# Patient Record
Sex: Male | Born: 1995 | Race: White | Hispanic: No | Marital: Single
Health system: Southern US, Community
[De-identification: ages and names within clinical notes are randomized; demographics above are authoritative.]

---
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
## 2021-05-09 ENCOUNTER — Other Ambulatory Visit: Payer: Self-pay

## 2021-05-09 ENCOUNTER — Ambulatory Visit (INDEPENDENT_AMBULATORY_CARE_PROVIDER_SITE_OTHER): Payer: 59 | Admitting: Clinical

## 2021-05-09 DIAGNOSIS — R4589 Other symptoms and signs involving emotional state: Secondary | ICD-10-CM | POA: Diagnosis not present

## 2021-05-09 DIAGNOSIS — F4010 Social phobia, unspecified: Secondary | ICD-10-CM
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
## 2021-05-09 NOTE — Progress Notes (Signed)
? ? ? ? ? ? ? ? ? ? ? ? ? ? ?  Eileen Leuthe, PhD ?

## 2021-05-09 NOTE — Progress Notes (Deleted)
? ? ? ? ? ? ? ? ? ? ? ? ? ? ?  Eileen Leuthe, PhD ?

## 2021-05-25 ENCOUNTER — Ambulatory Visit (INDEPENDENT_AMBULATORY_CARE_PROVIDER_SITE_OTHER): Payer: 59 | Admitting: Clinical

## 2021-05-25 DIAGNOSIS — F4010 Social phobia, unspecified: Secondary | ICD-10-CM

## 2021-05-25 DIAGNOSIS — R4589 Other symptoms and signs involving emotional state: Secondary | ICD-10-CM | POA: Diagnosis not present

## 2021-05-25 NOTE — Progress Notes (Addendum)
[facility] Counselor/Therapist Progress Note ? ?Legal name: Andrew Shannon, Preferred Name: "Andrew Shannon MRN: [id number]   ? ?Date: 05/26/21 ? ?Time Spent: 1:00 pm - 2:00 pm: 60 Minutes ? ?Type of Service Provided Individual Therapy  ?Type of Contact in-person ?Location: office       ? ?Mental Status Exam: ?Appearance:  Fairly Groomed     ?Behavior: Evasive (slightly)  ?Motor: Normal  ?Speech/Language:  Clear and Coherent  ?Affect: Tearful  ?Mood: sad at times  ?Thought process: normal  ?Thought content:   WNL  ?Sensory/Perceptual disturbances:   WNL  ?Orientation: oriented to person, place, time/date, and situation  ?Attention: Good  ?Concentration: Fair  ?Memory: WNL  ?Fund of knowledge:  Good  ?Insight:   Fair  ?Judgment:  Fair  ?Impulse Control: Fair  ? ?Risk Assessment: ?Danger to Self:  No - denied SI ?Self-injurious Behavior: No - denied SBI ?Danger to Others: None reported ?Duty to Warn:no ? ?Presenting Problems, Reported Symptoms, and /or Interim History: Andrew Shannon experienced the loss of a family friend in the interm between sessions. She chose not to attend the funeral due to concerns that her extended family did not know about her life in the past few years (e.g., her leaving college and gender transition). Goals were discussed and Andrew Shannon signed in a agreement.  ? ?Subjective: Andrew Shannon presented for an individual outpatient therapy session. The following was addressed during sessions.  ? ?As noted, Andrew Shannon experienced the lass of a family friend recently. She made the choice to not attend the funeral or visit her neighbor when they were in the hospital. Andrew Shannon reported that she was feeling shame about not going to the funeral or going to the hospital (she did not go to the hospital because hospitals are hard for her after her mother became ill and passed away quickly). Andrew Shannon reported that she often engages in escapism when she is in having difficulty. A way for her to acknowledge her grief was  discussed (she planned to bake something for the surviving spouse of her friend). Andrew Shannon rated her mood as between a 3 and 4 and (with a 1 being the worst her mood had ever been), and a 6 on the anxiety scale. Andrew Shannon had tried rating her mood between visits, but struggled and would "over think" things. Andrew Shannon also made a list of things that she wants to change about her life. However, she reported an increase in upset after seeing all of the things that she wanted to change. The link between thoughts-feelings-behavior and the relationship to anxiety and depression were discussed. The idea of slow, steady, and sustainable progress was discussed. Andrew Shannon selected one thing from the list to start.  ? ?Interventions/Psychotherapy Techniques Used During Session: Cognitive Behavioral Therapy, Motivational Interviewing, Grief Therapy, and Psycho-education/Bibliotherapy ? ?Diagnosis: Social anxiety disorder ? ?Symptoms of depression ? ?MENTAL HEALTH INTERVENTIONS USED DURING TREATMENT & PATIENT'S RESPONSE TO INTERVENTIONS:  ?Short-term Objective addressed today:Andrew Shannon will be able to verbally express understanding of the relationship between feelings of depression and their impact on thinking patterns and behaviors AND Andrew Shannon will be able to verbally express understanding of the relationship between feelings of depression and their impact on thinking patterns and behaviors.  ?Mental health techniques used: Objective was addressed in session through the use of Cognitive Behavioral Therapy and discussion. Andrew Shannon's response was positive.  ?Progress Toward Goal: progressing ? ?Short-term Objective addressed today: Andrew Shannon will be able to identify thinking patterns and beliefs that support depression ?Mental health techniques used: Objective  was addressed in session through the use of Cognitive Behavioral Therapy, discussion. Andrew Shannon's response was positive, but she reported that she felt similar at the beginning and end of the  session ?Progress Toward Goal: progressing  ?   ? ?PLAN  ?1. Andrew Shannon will return for a therapy session.   ?2. Homework Given:  Andrew Shannon will do laundry. This homework will be reviewed with the family at the next visit.  ?3. During the next session check on mood and anxiety and things she wants to change, discuss why she can tell herself things logically but it unable to believe them.   ? ? ?Andrew Chess, PhD ? ? ? ?

## 2021-05-26 NOTE — Progress Notes (Signed)
Legal Name: Lenore Manner ; Preferred Name: Andrew Shannon ?MRN [id number] ? ? ?Individual Treatment Plan  ? ?Plan Developed: 05/09/2021 &05/26/2021 ?Anticipated end date: 01/2022   ? ?Goals of therapy will be to help manage and/or decrease symptoms associated with Andrew Shannon's diagnosis to improve daily functioning. ? ?Who participated in treatment planning:  Therapist, patient ?The following goals were developed in collaboration with the patient.  ? ?Problem/Need: Anxiety - Andrew Shannon is experiencing high levels of anxiety.  ?Long-Term Goal #1:  ?Short-Term Objectives: ?Objective 1A: Andrew Shannon will be able to verbally express understanding of the relationship between feelings of anxiety and their impact on thinking patterns and behaviors.  ?Objective 1B: Andrew Shannon will be able to verbalize an understanding of the role that distorted thinking plays in creating fears, excessive worry, and ruminations ?Objective 1C: Andrew Shannon will be able to use coping strategies to manage anxiety feelings  ?Interventions: Cognitive Behavioral Therapy, Assertiveness/Communication, Mindfulness Meditation, Motivational Interviewing, and Psycho-education/Bibliotherapy, coping skills, and other evidenced-based practices will be used to promote progress towards healthy functioning and to help manage decrease symptoms associated with their diagnosis.  ?Treatment Regimen: Individual skill building sessions every 2-3 weeks to assist with reaching treatment goals/objectives ?Target Date: 01/2022 ?Responsible Party: therapist and patient ?Person delivering treatment: Licensed Psychologist Ronnie Derby, PhD will support the patient's ability to achieve the goals identified. ? ?Problem/Need: Depressed mood - Andrew Shannon is reporting high levels of depression currently. This goal may also include processing grief related to the loss of Andrew Shannon's mother ?Long-Term Goal #2: Reduce overall level, frequency, and intensity of the feelings of depression evidenced by self-report.   ? ?Short-Term Objectives: ?Objective 2A: Andrew Shannon will be able to verbally express understanding of the relationship between feelings of depression and their impact on thinking patterns and behaviors.  ?Objective 2B: Andrew Shannon will be able to identify thinking patterns and beliefs that support depression ?Objective 2C:  Andrew Shannon will be able to engage in behavioral activation strategies to combat depression within 6 months.  ?Interventions: Cognitive Behavioral Therapy, Mindfulness Meditation, Motivational Interviewing, and Grief Therapy, coping skills and other evidenced-based practices will be used to promote progress towards healthy functioning and to help manage decrease symptoms associated with their diagnosis.  ?Treatment Regimen: Individual skill building sessions every 2-3 weeks to assist with reaching treatment goals/objectives ?Target Date: 01/2022 ?Responsible Party: therapist and patient ?Person delivering treatment: Licensed Psychologist Ronnie Derby, PhD will support the patient's ability to achieve the goals identified. ? ?Problem/Need: social challenges - Andrew Shannon reported that she would like to be less hesitant to engage socially and not take "the easy way out" and avoid situations that make her anxious.   ?Long-Term Goal #3: Strengthen any social skill weaknesses and increase Andrew Shannon's tolerance of uncomfortable feelings.  ?Short-Term Objectives: ?Objective 3A: Identify any social skill weaknesses and start identifying ways to address them  ?Objective 3B: Andrew Shannon will be able to tolerate discomfort for a few minutes.  ?Interventions: Cognitive Behavioral Therapy, Psychologist, occupational, and Psycho-education/Bibliotherapy  and other evidenced-based practices will be used to promote progress towards healthy functioning and to help manage decrease symptoms associated with their diagnosis.  ?Treatment Regimen: Individual skill building sessions every 2-3 weeks to assist with reaching treatment  goals/objectives ?Target Date: 01/2022 ?Responsible Party: therapist and patient ?Person delivering treatment: Licensed Psychologist Ronnie Derby, PhD will support the patient's ability to achieve the goals identified. ? ? ? ?patient participated in treatment planning: ?_X_ contributed to goals and plan ?_X_ aware of plan content ?__ reviewed written plan ?__ refused to  participate ?__ unable to participate because _________________________________________ ? ? ?Progress and treatment plan will be reviewed periodically (at least every 12 months, or sooner if needed). This treatment plan was reviewed with Andrew Shannon on 05/25/2021 and patient signed in agreement.  ? ? ?Ronnie Derby, PhD ?

## 2021-05-26 NOTE — Addendum Note (Signed)
Addended by: Georgia Duff on: 05/26/2021 02:17 PM ? ? Modules accepted: Level of Service ? ?

## 2021-06-08 ENCOUNTER — Ambulatory Visit (INDEPENDENT_AMBULATORY_CARE_PROVIDER_SITE_OTHER): Payer: 59 | Admitting: Clinical

## 2021-06-08 DIAGNOSIS — R4589 Other symptoms and signs involving emotional state: Secondary | ICD-10-CM | POA: Diagnosis not present

## 2021-06-08 DIAGNOSIS — F4010 Social phobia, unspecified: Secondary | ICD-10-CM | POA: Diagnosis not present

## 2021-06-08 NOTE — Progress Notes (Addendum)
[facility] Counselor/Therapist Progress Note  Patient ID/Name: Andrew Shannon (legal name, Andrew Shannon)  MRN: [id number]    Date: 06/08/21  Time Spent: 4:05 pm - 5:10 pm: 65 Minutes  Type of Service Provided Individual Therapy  Type of Contact in-person Location: office   Mental Status Exam: Appearance:  Fairly Groomed     Behavior: Appropriate, slightly challenging   Motor: Normal  Speech/Language:  Normal Rate  Affect: Appropriate  Mood: normal and sometimes a bit sad  Thought process: normal  Thought content:   WNL  Sensory/Perceptual disturbances:   WNL  Orientation: oriented to person, place, time/date, and situation  Attention: Good  Concentration: Good  Memory: WNL  Fund of knowledge:  Good  Insight:   Fair  Judgment:  Fair  Impulse Control: Fair   Risk Assessment: Danger to Self:  No - denied Self-injurious Behavior: No Danger to Others: No - denied Duty to Warn:no  Presenting Problems, Reported Symptoms, and /or Interim History: Andrew Shannon reported that things are about the same for them.  Some ongoing challenges with their father were discussed.  Subjective: Andrew Shannon presented for an individual outpatient therapy session. The following was addressed during sessions.   Andrew Shannon reported that she talked to her father about going to visit the widow of the person that passed away but described that her father was unempathetic and they were unable to complete the activity. Andrew Shannon's father and brother reportedly had a talk with Andrew Shannon which was challenging.  Andrew Shannon described her overall living situation as poor.  Between visits Andrew Shannon was able to complete 2 loads of laundry but she focused on the amount of laundry that she had not completed.  She reported that by the end of the day, especially when she has had to engage in any social interactions, she feels drained and does not have the energy to complete much.  She identified her girlfriend as the thing that  makes her feel the best but does not talk to this person every day.  Ways to increase their communication was discussed but Andrew Shannon did not seem open to exploring these possibilities. Andrew Shannon reported that their social anxiety has continued, and described worrying about the therapy session for 3 days prior to the visit.  Therapist discussed with Andrew Shannon that her being willing to come to the visit was a success in and of itself and discussed Andrew Shannon's tendency to negate all progress by highlighting the areas where they are still struggling (described in session as "yes, but..."). Andrew Shannon acknowledged that they had a tendency to do this.  Therapist and Andrew Shannon had a discussion about the process of therapy as well. Andrew Shannon reported that engaging with therapy is often very challenging and although she has tried in the past she tends to get to about this point and then stops coming to sessions.  Therapist encouraged Andrew Shannon to provide feedback in an ongoing way, and explained that therapy will be more effective if Andrew Shannon is willing/able to provide this type of direct feedback.  The overall discussion seemed to go well and at the end several strategies suggested by the therapist were reviewed with Andrew Shannon indicating that once she was unwilling at this point to try (e.g., trying to contact her girlfriend more often), the strategies she would think about (e.g., going for a walk once or twice a week), and what she was willing to commit to doing (another load of laundry).  The possible use of medication was also included in this discussion, but Andrew Shannon did  not seem to be enthusiastic about this option at this time.   Interventions/Psychotherapy Techniques Used During Session: Cognitive Behavioral Therapy, Assertiveness/Communication, and Psycho-education/Bibliotherapy  Diagnosis: Social anxiety disorder  Symptoms of depression  MENTAL HEALTH INTERVENTIONS USED DURING TREATMENT & PATIENT'S RESPONSE TO INTERVENTIONS:   Short-term Objective addressed today: Andrew Shannon will be able to engage in behavioral activation strategies to combat depression within 6 months.  Mental health techniques used: Objective was addressed in session through the use of Cognitive Behavioral Therapy, Assertiveness/Communication, and Motivational Interviewing and discussion. Andrew Shannon's response was mixed - Andrew Shannon has a hard time identifying small attainable changes she could make to improve the quality of her life, rather she focuses on the many areas that she feels need to be addressed then and then feels overwhelmed and discouraged.  Progress Toward Goal: Some progress  Short-term Objective addressed today:Andrew Shannon will be able to verbalize an understanding of the role that distorted thinking plays in creating fears, excessive worry, and ruminations Mental health techniques used: Objective was addressed in session through the use of Cognitive Behavioral Therapy and discussion. Andrew Shannon's response was mixed - Andrew Shannon is sometimes able to verbalize the thoughts that she is having that is perpetuating or contributing to feelings of depression and anxiety but struggles to fully describe them and has difficulty with the idea of an alternative thought.  Progress Toward Goal: Some progress     PLAN  1. Andrew Shannon will return for a therapy session.   2. Homework Given: Complete a load of laundry, think about going for a walk once or twice a week. This homework will be reviewed with Andrew Shannon at the next visit.  3. During the next session check in on mood and anxiety, continue to work on addressing daily life tasks - Andrew Shannon reported that some of their reluctance to contact their girlfriend more often was related to being "ghosted" in the past and this should be further explored in future sessions.     Ronnie Derby, PhD   Individual Treatment Plan - please see the note from 05/26/2021 for complete treatment plan information   Problem/Need: Anxiety - Andrew Shannon is  experiencing high levels of anxiety.  Long-Term Goal #1:  Short-Term Objectives: Objective 1A: Andrew Shannon will be able to verbally express understanding of the relationship between feelings of anxiety and their impact on thinking patterns and behaviors.  Objective 1B: Andrew Shannon will be able to verbalize an understanding of the role that distorted thinking plays in creating fears, excessive worry, and ruminations Objective 1C: Andrew Shannon will be able to use coping strategies to manage anxiety feelings  Interventions: Cognitive Behavioral Therapy, Assertiveness/Communication, Mindfulness Meditation, Motivational Interviewing, and Psycho-education/Bibliotherapy, coping skills, and other evidenced-based practices will be used to promote progress towards healthy functioning and to help manage decrease symptoms associated with their diagnosis.  Treatment Regimen: Individual skill building sessions every 2-3 weeks to assist with reaching treatment goals/objectives Target Date: 01/2022 Responsible Party: therapist and patient Person delivering treatment: Licensed Psychologist Ronnie Derby, PhD will support the patient's ability to achieve the goals identified.  Problem/Need: Depressed mood - Andrew Shannon is reporting high levels of depression currently. This goal may also include processing grief related to the loss of Andrew Shannon's mother Long-Term Goal #2: Reduce overall level, frequency, and intensity of the feelings of depression evidenced by self-report.   Short-Term Objectives: Objective 2A: Andrew Shannon will be able to verbally express understanding of the relationship between feelings of depression and their impact on thinking patterns and behaviors.  Objective 2B: Andrew Shannon will be able to  identify thinking patterns and beliefs that support depression Objective 2C:  Andrew Shannon will be able to engage in behavioral activation strategies to combat depression within 6 months.  Interventions: Cognitive Behavioral Therapy, Mindfulness  Meditation, Motivational Interviewing, and Grief Therapy, coping skills and other evidenced-based practices will be used to promote progress towards healthy functioning and to help manage decrease symptoms associated with their diagnosis.  Treatment Regimen: Individual skill building sessions every 2-3 weeks to assist with reaching treatment goals/objectives Target Date: 01/2022 Responsible Party: therapist and patient Person delivering treatment: Licensed Psychologist Ronnie Derby, PhD will support the patient's ability to achieve the goals identified.  Problem/Need: social challenges - Andrew Shannon reported that she would like to be less hesitant to engage socially and not take "the easy way out" and avoid situations that make her anxious.   Long-Term Goal #3: Strengthen any social skill weaknesses and increase Andrew Shannon's tolerance of uncomfortable feelings.  Short-Term Objectives: Objective 3A: Identify any social skill weaknesses and start identifying ways to address them  Objective 3B: Andrew Shannon will be able to tolerate discomfort for a few minutes.  Interventions: Cognitive Behavioral Therapy, Psychologist, occupational, and Psycho-education/Bibliotherapy  and other evidenced-based practices will be used to promote progress towards healthy functioning and to help manage decrease symptoms associated with their diagnosis.  Treatment Regimen: Individual skill building sessions every 2-3 weeks to assist with reaching treatment goals/objectives Target Date: 01/2022 Responsible Party: therapist and patient Person delivering treatment: Licensed Psychologist Ronnie Derby, PhD will support the patient's ability to achieve the goals identified.    Ronnie Derby, PhD

## 2021-06-22 ENCOUNTER — Ambulatory Visit (INDEPENDENT_AMBULATORY_CARE_PROVIDER_SITE_OTHER): Payer: Commercial Managed Care - HMO | Admitting: Clinical

## 2021-06-22 DIAGNOSIS — R4589 Other symptoms and signs involving emotional state: Secondary | ICD-10-CM

## 2021-06-22 DIAGNOSIS — F4010 Social phobia, unspecified: Secondary | ICD-10-CM | POA: Diagnosis not present

## 2021-06-22 NOTE — Progress Notes (Signed)
[facility] Counselor/Therapist Progress Note  Patient ID: Press photographerCammie Shannon (legal name Andrew Shannon MannerChristian A Shannon)  MRN: [id number]    Date: 06/22/21  Time Spent: 11:10 am - 12:17 pm: 67 Minutes  Type of Service Provided Individual Therapy  Type of Contact in-person Location: office  Mental Status Exam: Appearance:  Casual     Behavior: Sharing  Motor: Normal  Speech/Language:  Clear and Coherent  Affect: Congruent  Mood: normal  Thought process: normal  Thought content:   WNL  Sensory/Perceptual disturbances:   WNL  Orientation: oriented to person, place, time/date, and situation  Attention: Good  Concentration: Good  Memory: WNL  Fund of knowledge:  Fair  Insight:   Fair  Judgment:  Fair  Impulse Control: Good   Risk Assessment: no apparent indicators of SI or HI during current visit   Presenting Problems, Reported Symptoms, and /or Interim History: Andrew Shannon presented for a therapy session addressing anxiety and mood concerns. Andrew Shannon reported that they started receiving gender affirming care from a new physician and was feeling positive about this they began several new medications including spironolactone, estradiol, and finasteride.   Subjective: Andrew Shannon presented for an individual outpatient therapy session. The following was addressed during sessions.   Andrew Shannon was praised for attending the visit given that during the previous session she indicated that she often stopped attending therapy after the first few visits.  During today's visit Andrew Shannon was able to accept this praise and the difference between this acceptance and previous rejections of positive feedback was discussed. Andrew Shannon reported that her mood was positive the day of the visit.  She had a conversation with her girlfriend where she stated out loud that she is afraid to feel emotions because she fears them being overwhelming.    As noted she reported that she has began gender affirming care and medications  related to this.  Andrew Shannon indicated that once she says she is going to do something she always follows through, but can have difficulty moving tasks from contemplation to commitment.  Andrew Shannon indicated that she does a "manageability check" to ensure that she has the resources to commit to tasks.  Although this was praised as a good strategy in general, it was noted that this may result in her committing to only a minimal number of tasks.  She indicated that she is an excellent self advocate but sometimes is too overprotective of herself.  Andrew Shannon was asked to consider tasks on her list including home management tasks, cooking, exercise, and social connections and determine if she was ready to try to tackle 1 of those tasks.  During the previous session Andrew Shannon indicated that she had been ghosted in the past.  This was further explored.  She described that she used to have a very close group of friends.  However after being invited to be the best man for a very close friend she was subsequently removed from the best man position and stipulations were placed on her attending the wedding related to her transition.  Because of being unable to attend as she was, Andrew Shannon opted not to attend the wedding.  After this many of her friends in this group stopped contacting her or talking to her.  Andrew Shannon's feelings regarding this incident were discussed.  New social opportunities were also discussed.   Interventions/Psychotherapy Techniques Used During Session: Cognitive Behavioral Therapy and Insight-Oriented  Diagnosis: Social anxiety disorder  Symptoms of depression  MENTAL HEALTH INTERVENTIONS USED DURING TREATMENT & PATIENT'S RESPONSE TO INTERVENTIONS:  Short-term Objective addressed today:Andrew Shannon will be able to engage in behavioral activation strategies to combat depression Mental health techniques used: Objective was addressed in session through the use of Cognitive Behavioral Therapy, discussion, and  making/reviewing lists. Andrew Shannon's response was positive .  Progress Toward Goal: Progressing  Short-term Objective addressed today:Andrew Shannon will be able to verbally express understanding of the relationship between feelings of depression and their impact on thinking patterns and behaviors.  Mental health techniques used: Objective was addressed in session through the use of Cognitive Behavioral Therapy and discussion.  Andrew Shannon's response was generally positive.  Progress Toward Goal: Progressing -Andrew Shannon is usually able to logically talk about how thoughts are impacting feelings and behavior but struggles a bit with applying this at times   PLAN  1.  Andrew Shannon will return for a therapy session.   2. Homework Given: Andrew Shannon will think about some of the tasks that she would like to engage in more including cooking, exercise, and home tasks and determine if she is at a place where she could start to tackle 1 or a subset of 1 of these tasks Andrew Shannon is also going to think about any potential social opportunities that may be available to them. This homework will be reviewed with Andrew Shannon at the next visit.  3. During the next session check-in on anxiety and depression, talk about social opportunities.     Ronnie Derby, PhD      Individual Treatment Plan - please see the note from 05/26/2021 for complete treatment plan information   Problem/Need: Anxiety - Andrew Shannon is experiencing high levels of anxiety.  Long-Term Goal #1:  Short-Term Objectives: Objective 1A: Andrew Shannon will be able to verbally express understanding of the relationship between feelings of anxiety and their impact on thinking patterns and behaviors.  Objective 1B: Andrew Shannon will be able to verbalize an understanding of the role that distorted thinking plays in creating fears, excessive worry, and ruminations Objective 1C: Andrew Shannon will be able to use coping strategies to manage anxiety feelings  Interventions: Cognitive Behavioral Therapy,  Assertiveness/Communication, Mindfulness Meditation, Motivational Interviewing, and Psycho-education/Bibliotherapy, coping skills, and other evidenced-based practices will be used to promote progress towards healthy functioning and to help manage decrease symptoms associated with their diagnosis.  Treatment Regimen: Individual skill building sessions every 2-3 weeks to assist with reaching treatment goals/objectives Target Date: 01/2022 Responsible Party: therapist and patient Person delivering treatment: Licensed Psychologist Ronnie Derby, PhD will support the patient's ability to achieve the goals identified. Resolved:   No  Problem/Need: Depressed mood - Andrew Shannon is reporting high levels of depression currently. This goal may also include processing grief related to the loss of Andrew Shannon's mother Long-Term Goal #2: Reduce overall level, frequency, and intensity of the feelings of depression evidenced by self-report.   Short-Term Objectives: Objective 2A: Andrew Shannon will be able to verbally express understanding of the relationship between feelings of depression and their impact on thinking patterns and behaviors.  Objective 2B: Andrew Shannon will be able to identify thinking patterns and beliefs that support depression Objective 2C:  Andrew Shannon will be able to engage in behavioral activation strategies to combat depression within 6 months.  Interventions: Cognitive Behavioral Therapy, Mindfulness Meditation, Motivational Interviewing, and Grief Therapy, coping skills and other evidenced-based practices will be used to promote progress towards healthy functioning and to help manage decrease symptoms associated with their diagnosis.  Treatment Regimen: Individual skill building sessions every 2-3 weeks to assist with reaching treatment goals/objectives Target Date: 01/2022 Responsible Party: therapist and patient Person delivering treatment:  Licensed Psychologist Ronnie Derby, PhD will support the patient's ability  to achieve the goals identified. Resolved:   No  Problem/Need: social challenges - Andrew Shannon reported that she would like to be less hesitant to engage socially and not take "the easy way out" and avoid situations that make her anxious.   Long-Term Goal #3: Strengthen any social skill weaknesses and increase Andrew Shannon's tolerance of uncomfortable feelings.  Short-Term Objectives: Objective 3A: Identify any social skill weaknesses and start identifying ways to address them  Objective 3B: Andrew Shannon will be able to tolerate discomfort for a few minutes.  Interventions: Cognitive Behavioral Therapy, Psychologist, occupational, and Psycho-education/Bibliotherapy  and other evidenced-based practices will be used to promote progress towards healthy functioning and to help manage decrease symptoms associated with their diagnosis.  Treatment Regimen: Individual skill building sessions every 2-3 weeks to assist with reaching treatment goals/objectives Target Date: 01/2022 Responsible Party: therapist and patient Person delivering treatment: Licensed Psychologist Ronnie Derby, PhD will support the patient's ability to achieve the goals identified. Resolved:   No   Ronnie Derby, PhD

## 2021-07-06 ENCOUNTER — Ambulatory Visit (INDEPENDENT_AMBULATORY_CARE_PROVIDER_SITE_OTHER): Payer: Commercial Managed Care - HMO | Admitting: Clinical

## 2021-07-06 DIAGNOSIS — R4589 Other symptoms and signs involving emotional state: Secondary | ICD-10-CM | POA: Diagnosis not present

## 2021-07-06 DIAGNOSIS — F4010 Social phobia, unspecified: Secondary | ICD-10-CM | POA: Diagnosis not present

## 2021-07-07 NOTE — Progress Notes (Signed)
Behavioral Health Counselor/Therapist Progress Note  Patient ID: Andrew Shannon (legal name Andrew Shannon)   MRN: [id number]    Date: 07/06/2021  Time Spent: 2:07 pm - 3:05 pm: 58 Minutes  Type of Service Provided Individual Therapy  Type of Contact in-person Location: office  Mental Status Exam: Appearance:  Casual     Behavior: Appropriate  Motor: Normal  Speech/Language:  Clear and Coherent  Affect: Constricted  Mood: Slightly sad at times  Thought process: normal  Thought content:   WNL  Sensory/Perceptual disturbances:   WNL  Orientation: oriented to person, place, time/date, and situation  Attention: Fair  Concentration: Fair  Memory: WNL  Fund of knowledge:  Good  Insight:   Fair  Judgment:  Fair  Impulse Control: Good   Risk Assessment: No apparent indicators of SI or HI during session   Presenting Problems, Reported Symptoms, and /or Interim History: [name] presented for a visit to address symptoms of depression and life stress  Subjective: [name] presented for an individual outpatient therapy session. The following was addressed during sessions.   [name] reported that her birthday was the day before the session.  She had a birthday dinner with her family which was and unexpectedly negative experience.  Feelings associated with this along with how to manage similar situations in the future were discussed.  Prior to her birthday [name] reported that her mood had been good and her anxiety level had been lower.  Her girlfriend is in town which has been a great support to [employer].  Next steps for [name] in terms of improving daily life tasks were discussed including unpacking the remaining few kitchen boxes and cooking a meal for her girlfriend while she is in town.  [name] reported having a complicated relationship with her father which will be further discussed in future visits.   Interventions/Psychotherapy Techniques Used During Session: Cognitive  Behavioral Therapy and Insight-Oriented  Diagnosis: Social anxiety disorder  Symptoms of depression  MENTAL HEALTH INTERVENTIONS USED DURING TREATMENT & PATIENT'S RESPONSE TO INTERVENTIONS:  Short-term Objective addressed today: [name] will be able to engage in behavioral activation strategies to combat depression within 6 months.  Mental health techniques used: Objective was addressed in session through the use of Cognitive Behavioral Therapy and discussion.  [name]'s response was generally positive.  Progress Toward Goal: Progressing   PLAN  1.  [name] will return for a therapy session.   2. Homework Given: Unpacked the remaining kitchen boxes and cook a meal for her girlfriend, thinking about cooking activities in the future, engage in some strategies to help lessen the negative feelings that will be associated with her girlfriend returning home e.g., plan something with her brother). This homework will be reviewed with [name] at the next visit.  3. During the next session check in on mood and anxiety, check in on life tasks, determine if [name] wants to talk about grief associated with the loss of her mother and/or relationship with her father and any future visits.     Ronnie Derby, PhD    Individual Treatment Plan - please see the note from 05/26/2021 for complete treatment plan information   Problem/Need: Anxiety - [name] is experiencing high levels of anxiety.  Long-Term Goal #1:  Short-Term Objectives: Objective 1A: [name] will be able to verbally express understanding of the relationship between feelings of anxiety and their impact on thinking patterns and behaviors.  Objective 1B: [name] will be able to verbalize an understanding of the role that distorted thinking  plays in creating fears, excessive worry, and ruminations Objective 1C: [name] will be able to use coping strategies to manage anxiety feelings  Interventions: Cognitive Behavioral Therapy,  Assertiveness/Communication, Mindfulness Meditation, Motivational Interviewing, and Psycho-education/Bibliotherapy, coping skills, and other evidenced-based practices will be used to promote progress towards healthy functioning and to help manage decrease symptoms associated with their diagnosis.  Treatment Regimen: Individual skill building sessions every 2-3 weeks to assist with reaching treatment goals/objectives Target Date: 01/2022 Responsible Party: therapist and patient Person delivering treatment: Licensed Psychologist Ronnie Derby, PhD will support the patient's ability to achieve the goals identified. Resolved:   No  Problem/Need: Depressed mood - [name] is reporting high levels of depression currently. This goal may also include processing grief related to the loss of [name]'s mother Long-Term Goal #2: Reduce overall level, frequency, and intensity of the feelings of depression evidenced by self-report.   Short-Term Objectives: Objective 2A: [name] will be able to verbally express understanding of the relationship between feelings of depression and their impact on thinking patterns and behaviors.  Objective 2B: [name] will be able to identify thinking patterns and beliefs that support depression Objective 2C:  [name] will be able to engage in behavioral activation strategies to combat depression within 6 months.  Interventions: Cognitive Behavioral Therapy, Mindfulness Meditation, Motivational Interviewing, and Grief Therapy, coping skills and other evidenced-based practices will be used to promote progress towards healthy functioning and to help manage decrease symptoms associated with their diagnosis.  Treatment Regimen: Individual skill building sessions every 2-3 weeks to assist with reaching treatment goals/objectives Target Date: 01/2022 Responsible Party: therapist and patient Person delivering treatment: Licensed Psychologist Ronnie Derby, PhD will support the patient's ability  to achieve the goals identified. Resolved:   No  Problem/Need: social challenges - [name] reported that she would like to be less hesitant to engage socially and not take "the easy way out" and avoid situations that make her anxious.   Long-Term Goal #3: Strengthen any social skill weaknesses and increase [name]'s tolerance of uncomfortable feelings.  Short-Term Objectives: Objective 3A: Identify any social skill weaknesses and start identifying ways to address them  Objective 3B: [name] will be able to tolerate discomfort for a few minutes.  Interventions: Cognitive Behavioral Therapy, Psychologist, occupational, and Psycho-education/Bibliotherapy  and other evidenced-based practices will be used to promote progress towards healthy functioning and to help manage decrease symptoms associated with their diagnosis.  Treatment Regimen: Individual skill building sessions every 2-3 weeks to assist with reaching treatment goals/objectives Target Date: 01/2022 Responsible Party: therapist and patient Person delivering treatment: Licensed Psychologist Ronnie Derby, PhD will support the patient's ability to achieve the goals identified. Resolved:   No  Ronnie Derby, PhD

## 2021-07-20 ENCOUNTER — Ambulatory Visit: Payer: Commercial Managed Care - HMO | Admitting: Clinical

## 2021-07-20 ENCOUNTER — Ambulatory Visit (INDEPENDENT_AMBULATORY_CARE_PROVIDER_SITE_OTHER): Payer: Commercial Managed Care - HMO | Admitting: Clinical

## 2021-07-20 DIAGNOSIS — R4589 Other symptoms and signs involving emotional state: Secondary | ICD-10-CM

## 2021-07-20 DIAGNOSIS — F4010 Social phobia, unspecified: Secondary | ICD-10-CM | POA: Diagnosis not present

## 2021-07-20 NOTE — Progress Notes (Signed)
[facility] Counselor/Therapist Progress Note  Patient ID: Andrew Shannon (legal name: Andrew Shannon) MRN: [id number]    Date: 07/20/21  Time Spent: 10:00 am - 11:05 am: 79 Minutes  Type of Service Provided Individual Therapy  Type of Contact in-person Location: office   Mental Status Exam: Appearance:  Casual and Fairly Groomed     Behavior: Appropriate and Sharing  Motor: Normal  Speech/Language:  Clear and Coherent and Normal Rate  Affect: Constricted  Mood: normal  Thought process: normal and goal directed  Thought content:   WNL  Sensory/Perceptual disturbances:   WNL  Orientation: oriented to person, place, time/date, and situation  Attention: Good  Concentration: Good  Memory: WNL  Fund of knowledge:  Good  Insight:   Fair  Judgment:  Fair  Impulse Control: Good   Risk Assessment: No apparent indicators of SI or HI during the visit  Presenting Problems, Reported Symptoms, and /or Interim History: [name] presented to the session to address significant upheaval and a stressful situation that occurred recently that resulted in a high level of anxiety.  More specifically, [name] reported that she and her girlfriend had been kicked out of her girlfriend's parents house.  Subjective: [name] presented for an individual outpatient therapy session.  She was accompanied by her girlfriend (for emotional support) and provided verbal permission for the therapist to interact with her girlfriend.  A release was signed to allow for further communication if necessary.  The following was addressed during sessions.   [name] reported a high level of stress after being kicked out of her girlfriend's parents house when visiting. This situation was reportedly precipitated by several very negative statements made about [name] by her girlfriend's parent, including some reported threats to make things difficult for [name] and her girlfriend. For example, [name] and her girlfriend  report fears that her girlfriend's parents would fabricate information to try to make things difficult for [name]. For instance, [name] and her girlfriend (who reported they were currently 65 years old) reported that they have known each other for 2 years.  They met online. When they first began talking, [name]'s girlfriend was 41 years old but reportedly lied about her age. Nevertheless, they both reported that they did not have any in-person contact until after [name]'s girlfriend turned 64 and her girlfriend reported that she never sent any photos or anything like that to [name] when they were communicating online prior to meeting. However, [name] and her girlfriend indicated that they were fearful that the parents would contact authorities not due to any wrongdoing but in an effort to make things difficult for [name].   Emotions related to the hurtful and frightening  statements that were made about [name] were discussed.  [name] and her girlfriend discussed some of the positives about now living together as well as stressors related to this not being on their timeline or their decision.  There are many logistical things that need to be worked out which are also causing a tremendous amount of stress.  Strategies to manage that stress and anxiety were discussed.  Strategies to adjust to the year new reality were also discussed.   Interventions/Psychotherapy Techniques Used During Session: Cognitive Behavioral Therapy  Diagnosis: Social anxiety disorder  Symptoms of depression  MENTAL HEALTH INTERVENTIONS USED DURING TREATMENT & PATIENT'S RESPONSE TO INTERVENTIONS:  Short-term Objective addressed today:[name] will be able to use coping strategies to manage anxiety feelings  Mental health techniques used: Objective was addressed in session through the use of  Cognitive Behavioral Therapy and discussion.  [name]'s response was mixed -although they seemed very open to the strategies being discussed they  are experiencing a very high level of stressors at this moment so finding time in a way to implement these coping skills may be a bit challenging.  Progress Toward Goal: Progressing   PLAN  1.  [name] will return for a therapy session.   2. Homework Given: Try to spend 5 to 10 minutes a day engaging in reflection and check ins to determine stress and anxiety levels and use coping strategies to deal with that. This homework will be reviewed with [name] at the next visit.  3. During the next session check-in on anxiety and mood, check in on adjustment, check in on any additional stressors.Zara Chess, PhD   Individual Treatment Plan - please see the note from 05/26/2021 for complete treatment plan information   Problem/Need: Anxiety - [name] is experiencing high levels of anxiety.  Long-Term Goal #1:  Short-Term Objectives: Objective 1A: [name] will be able to verbally express understanding of the relationship between feelings of anxiety and their impact on thinking patterns and behaviors.  Objective 1B: [name] will be able to verbalize an understanding of the role that distorted thinking plays in creating fears, excessive worry, and ruminations Objective 1C: [name] will be able to use coping strategies to manage anxiety feelings  Interventions: Cognitive Behavioral Therapy, Assertiveness/Communication, Mindfulness Meditation, Motivational Interviewing, and Psycho-education/Bibliotherapy, coping skills, and other evidenced-based practices will be used to promote progress towards healthy functioning and to help manage decrease symptoms associated with their diagnosis.  Treatment Regimen: Individual skill building sessions every 2-3 weeks to assist with reaching treatment goals/objectives Target Date: 01/2022 Responsible Party: therapist and patient Person delivering treatment: Licensed Psychologist Zara Chess, PhD will support the patient's ability to achieve the goals  identified. Resolved:   No  Problem/Need: Depressed mood - [name] is reporting high levels of depression currently. This goal may also include processing grief related to the loss of [name]'s mother Long-Term Goal #2: Reduce overall level, frequency, and intensity of the feelings of depression evidenced by self-report.   Short-Term Objectives: Objective 2A: [name] will be able to verbally express understanding of the relationship between feelings of depression and their impact on thinking patterns and behaviors.  Objective 2B: [name] will be able to identify thinking patterns and beliefs that support depression Objective 2C:  [name] will be able to engage in behavioral activation strategies to combat depression within 6 months.  Interventions: Cognitive Behavioral Therapy, Mindfulness Meditation, Motivational Interviewing, and Grief Therapy, coping skills and other evidenced-based practices will be used to promote progress towards healthy functioning and to help manage decrease symptoms associated with their diagnosis.  Treatment Regimen: Individual skill building sessions every 2-3 weeks to assist with reaching treatment goals/objectives Target Date: 01/2022 Responsible Party: therapist and patient Person delivering treatment: Licensed Psychologist Zara Chess, PhD will support the patient's ability to achieve the goals identified. Resolved:   No  Problem/Need: social challenges - [name] reported that she would like to be less hesitant to engage socially and not take "the easy way out" and avoid situations that make her anxious.   Long-Term Goal #3: Strengthen any social skill weaknesses and increase [name]'s tolerance of uncomfortable feelings.  Short-Term Objectives: Objective 3A: Identify any social skill weaknesses and start identifying ways to address them  Objective 3B: [name] will be able to tolerate discomfort for a few minutes.  Interventions: Cognitive Behavioral Therapy,  Social  Company secretary, and Psycho-education/Bibliotherapy  and other evidenced-based practices will be used to promote progress towards healthy functioning and to help manage decrease symptoms associated with their diagnosis.  Treatment Regimen: Individual skill building sessions every 2-3 weeks to assist with reaching treatment goals/objectives Target Date: 01/2022 Responsible Party: therapist and patient Person delivering treatment: Licensed Psychologist Zara Chess, PhD will support the patient's ability to achieve the goals identified. Resolved:   No  Zara Chess, PhD

## 2021-08-10 ENCOUNTER — Ambulatory Visit (INDEPENDENT_AMBULATORY_CARE_PROVIDER_SITE_OTHER): Payer: Commercial Managed Care - HMO | Admitting: Clinical

## 2021-08-10 DIAGNOSIS — R4589 Other symptoms and signs involving emotional state: Secondary | ICD-10-CM

## 2021-08-10 DIAGNOSIS — F4010 Social phobia, unspecified: Secondary | ICD-10-CM | POA: Diagnosis not present

## 2021-08-10 NOTE — Progress Notes (Signed)
[facility] Counselor/Therapist Progress Note  Patient ID: Andrew Shannon (legal name Andrew Shannon) , MRN: [id number]    Date: 08/10/21  Time Spent: 11:06 am - 12:12 pm: 66 Minutes  Type of Service Provided Individual Therapy  Type of Contact in-person Location: office  Mental Status Exam: Appearance:  Casual     Behavior: Appropriate and Sharing  Motor: Normal  Speech/Language:  Clear and Coherent  Affect: Appropriate  Mood: normal   Thought process: normal  Thought content:   WNL  Sensory/Perceptual disturbances:   WNL  Orientation: oriented to person, place, time/date, and situation  Attention: Good  Concentration: Good  Memory: WNL  Fund of knowledge:  Good  Insight:   Fair to good  Judgment:  Fair to good  Impulse Control: Good   Of note, Andrew Shannon was more talkative and open during the current session than she has at times been in the past  Risk Assessment: No apparent indicators of HI or SI during the visit  Presenting Problems, Reported Symptoms, and /or Interim History: Andrew Shannon reported that things were going okay and that they are making the adjustment to living with their girlfriend.  Subjective: Andrew Shannon and their partner Helmut Muster) presented for an individual outpatient therapy session. A release has been previously obtained to allow information to be provided to [place].  Andrew Shannon indicated that they wanted to continue to have [place] in session as an emotional support.  Although [place] participated a bit in the beginning of session their verbal participation was limited as the session progressed (though they continue to provide emotional and physical support to Andrew Shannon) as the focus of the discussion was on issues solely related to Andrew Shannon.  The following was addressed during sessions.   Andrew Shannon reported that she and Helmut Muster have adjusted well to living together; though they have had a few minor bumps to make they have generally been able to navigate these  well.  Andrew Shannon reported achieving a number of tasks from the list that they shared with the therapist during the initial sessions including home tasks, going for walks, and cooking.  Andrew Shannon rated her mood today as and 8 out of 10 (1 is sad, 5 is neutral, 10 is happy).  Andrew Shannon rated her anxiety as a 5 out of 10 (with 10 being a very high level of anxiety).  Anxiety is stemming from the planned trip to Alicia's parents to retrieve the rest of their belongings and their pet as well as some financial stressors.  Planning for the trip in a safe and supportive way was discussed.  Stressors associated with Andrew Shannon's job and finances were discussed and problem solved.  Negative thinking associated with Andrew Shannon's current work position were also discussed and more adaptive or helpful thoughts were generated.  Interventions/Psychotherapy Techniques Used During Session: Cognitive Behavioral Therapy and Assertiveness/Communication  Diagnosis: Social anxiety disorder  Symptoms of depression  MENTAL HEALTH INTERVENTIONS USED DURING TREATMENT & PATIENT'S RESPONSE TO INTERVENTIONS:  Short-term Objective addressed today:Andrew Shannon will be able to identify thinking patterns and beliefs that support depression Mental health techniques used: Objective was addressed in session through the use of Cognitive Behavioral Therapy and Assertiveness/Communication and discussion.  Andrew Shannon's response was positive.  Progress Toward Goal: Andrew Shannon has shown growth in her ability to identify negative thinking and generate more adaptive thoughts  Short-term Objective addressed today:Andrew Shannon will be able to tolerate discomfort for a few minutes.  Mental health techniques used: Objective was addressed in session through the use of Cognitive Behavioral Therapy and  Assertiveness/Communication and discussion.  Andrew Shannon's response was positive -Andrew Shannon was able to discuss their previous tendency of avoidance coping and identify ways to potentially change  this in relation to their approach to finances, as well as seeming open to cognitive changes that may make it easier for them to engage in these behaviors that can be slightly anxiety provoking.  Andrew Shannon was also asked to think about future employment and what she might be interested in, which she indicated she sometimes avoids thinking about. Progress Toward Goal: Progressing     PLAN  1. Andrew Shannon will return for a therapy session.   2. Homework Given:  think about what long term future may hold, prepare for trip to partner's parents and the emotional challenges associated with that. This homework will be reviewed with Andrew Shannon at the next visit.  3. During the next session check in on trip preparations, adjustment to new living situation, thoughts about careers, mood and anxiety.     Zara Chess, PhD    Individual Treatment Plan - please see the note from 05/26/2021 for complete treatment plan information   Problem/Need: Anxiety - Andrew Shannon is experiencing high levels of anxiety.  Long-Term Goal #1:  Short-Term Objectives: Objective 1A: Andrew Shannon will be able to verbally express understanding of the relationship between feelings of anxiety and their impact on thinking patterns and behaviors.  Objective 1B: Andrew Shannon will be able to verbalize an understanding of the role that distorted thinking plays in creating fears, excessive worry, and ruminations Objective 1C: Andrew Shannon will be able to use coping strategies to manage anxiety feelings  Interventions: Cognitive Behavioral Therapy, Assertiveness/Communication, Mindfulness Meditation, Motivational Interviewing, and Psycho-education/Bibliotherapy, coping skills, and other evidenced-based practices will be used to promote progress towards healthy functioning and to help manage decrease symptoms associated with their diagnosis.  Treatment Regimen: Individual skill building sessions every 2-3 weeks to assist with reaching treatment goals/objectives Target Date:  01/2022 Responsible Party: therapist and patient Person delivering treatment: Licensed Psychologist Zara Chess, PhD will support the patient's ability to achieve the goals identified. Resolved:   No  Problem/Need: Depressed mood - Andrew Shannon is reporting high levels of depression currently. This goal may also include processing grief related to the loss of Andrew Shannon's mother Long-Term Goal #2: Reduce overall level, frequency, and intensity of the feelings of depression evidenced by self-report.   Short-Term Objectives: Objective 2A: Andrew Shannon will be able to verbally express understanding of the relationship between feelings of depression and their impact on thinking patterns and behaviors.  Objective 2B: Andrew Shannon will be able to identify thinking patterns and beliefs that support depression Objective 2C:  Andrew Shannon will be able to engage in behavioral activation strategies to combat depression within 6 months.  Interventions: Cognitive Behavioral Therapy, Mindfulness Meditation, Motivational Interviewing, and Grief Therapy, coping skills and other evidenced-based practices will be used to promote progress towards healthy functioning and to help manage decrease symptoms associated with their diagnosis.  Treatment Regimen: Individual skill building sessions every 2-3 weeks to assist with reaching treatment goals/objectives Target Date: 01/2022 Responsible Party: therapist and patient Person delivering treatment: Licensed Psychologist Zara Chess, PhD will support the patient's ability to achieve the goals identified. Resolved:   No  Problem/Need: social challenges - Andrew Shannon reported that she would like to be less hesitant to engage socially and not take "the easy way out" and avoid situations that make her anxious.   Long-Term Goal #3: Strengthen any social skill weaknesses and increase Andrew Shannon's tolerance of uncomfortable feelings.  Short-Term Objectives: Objective 3A:  Identify any social skill weaknesses  and start identifying ways to address them  Objective 3B: Andrew Shannon will be able to tolerate discomfort for a few minutes.  Interventions: Cognitive Behavioral Therapy, Psychologist, occupational, and Psycho-education/Bibliotherapy  and other evidenced-based practices will be used to promote progress towards healthy functioning and to help manage decrease symptoms associated with their diagnosis.  Treatment Regimen: Individual skill building sessions every 2-3 weeks to assist with reaching treatment goals/objectives Target Date: 01/2022 Responsible Party: therapist and patient Person delivering treatment: Licensed Psychologist Ronnie Derby, PhD will support the patient's ability to achieve the goals identified. Resolved:   No  Ronnie Derby, PhD

## 2021-08-28 ENCOUNTER — Ambulatory Visit: Payer: Commercial Managed Care - HMO | Admitting: Clinical

## 2021-08-28 ENCOUNTER — Telehealth: Payer: Self-pay | Admitting: Clinical

## 2021-08-28 NOTE — Telephone Encounter (Signed)
LM regarding missed visit and asking for a call back if want to reschedule.   Ronnie Derby, PhD
